# Patient Record
Sex: Male | Born: 1965 | Race: Black or African American | Hispanic: No | Marital: Married | Smoking: Never smoker
Health system: Southern US, Community
[De-identification: ages and names within clinical notes are randomized; demographics above are authoritative.]

## PROBLEM LIST (undated history)

## (undated) ENCOUNTER — Ambulatory Visit (HOSPITAL_COMMUNITY): Admission: EM | Source: Home / Self Care

## (undated) DIAGNOSIS — C801 Malignant (primary) neoplasm, unspecified: Secondary | ICD-10-CM

---

## 1998-10-04 ENCOUNTER — Encounter: Payer: Self-pay | Admitting: Emergency Medicine

## 1998-10-05 ENCOUNTER — Inpatient Hospital Stay (HOSPITAL_COMMUNITY): Admission: EM | Admit: 1998-10-05 | Discharge: 1998-10-09 | Payer: Self-pay | Admitting: Emergency Medicine

## 2000-10-24 ENCOUNTER — Encounter: Payer: Self-pay | Admitting: Family Medicine

## 2000-10-24 ENCOUNTER — Encounter: Admission: RE | Admit: 2000-10-24 | Discharge: 2000-10-24 | Payer: Self-pay | Admitting: Family Medicine

## 2001-10-25 ENCOUNTER — Emergency Department (HOSPITAL_COMMUNITY): Admission: EM | Admit: 2001-10-25 | Discharge: 2001-10-26 | Payer: Self-pay | Admitting: Emergency Medicine

## 2001-10-26 ENCOUNTER — Encounter: Payer: Self-pay | Admitting: Emergency Medicine

## 2002-12-21 ENCOUNTER — Encounter: Admission: RE | Admit: 2002-12-21 | Discharge: 2002-12-21 | Payer: Self-pay | Admitting: Internal Medicine

## 2002-12-21 ENCOUNTER — Encounter: Payer: Self-pay | Admitting: Internal Medicine

## 2003-08-24 ENCOUNTER — Emergency Department (HOSPITAL_COMMUNITY): Admission: EM | Admit: 2003-08-24 | Discharge: 2003-08-24 | Payer: Self-pay | Admitting: Emergency Medicine

## 2005-07-20 ENCOUNTER — Encounter: Admission: RE | Admit: 2005-07-20 | Discharge: 2005-07-20 | Payer: Self-pay | Admitting: Internal Medicine

## 2005-11-03 ENCOUNTER — Encounter: Payer: Self-pay | Admitting: Emergency Medicine

## 2016-12-09 ENCOUNTER — Encounter (HOSPITAL_COMMUNITY): Payer: Self-pay | Admitting: Emergency Medicine

## 2016-12-09 ENCOUNTER — Ambulatory Visit (HOSPITAL_COMMUNITY)
Admission: EM | Admit: 2016-12-09 | Discharge: 2016-12-09 | Disposition: A | Discharge status: Home / Self Care | Payer: Self-pay | Attending: Internal Medicine | Admitting: Internal Medicine

## 2016-12-09 DIAGNOSIS — Z760 Encounter for issue of repeat prescription: Secondary | ICD-10-CM

## 2016-12-09 MED ORDER — VALACYCLOVIR HCL 500 MG PO TABS: 500.0000 mg | ORAL_TABLET | Freq: Two times a day (BID) | ORAL | 1 refills | Status: AC

## 2016-12-09 NOTE — ED Triage Notes (Signed)
Patient requesting valtrex refill.

## 2016-12-09 NOTE — ED Provider Notes (Signed)
CSN: [id number]     Arrival date & time 12/09/16  1023 History   None    Chief Complaint  Patient presents with  . Medication Refill   (Consider location/radiation/quality/duration/timing/severity/associated sxs/prior Treatment) Patient request refills on valtrex.   The history is provided by the patient.    History reviewed. No pertinent past medical history. History reviewed. No pertinent surgical history. No family history on file. Social History  Substance Use Topics  . Smoking status: Never Smoker  . Smokeless tobacco: Not on file  . Alcohol use Yes    Review of Systems  Constitutional: Negative.   HENT: Negative.   Eyes: Negative.   Respiratory: Negative.   Cardiovascular: Negative.   Gastrointestinal: Negative.   Endocrine: Negative.   Genitourinary: Negative.   Musculoskeletal: Negative.   Allergic/Immunologic: Negative.   Neurological: Negative.   Hematological: Negative.   Psychiatric/Behavioral: Negative.     Allergies  Patient has no known allergies.  Home Medications   Prior to Admission medications   Medication Sig Start Date End Date Taking? Authorizing Provider  valACYclovir (VALTREX) 500 MG tablet Take 1 tablet (500 mg total) by mouth 2 (two) times daily. 12/09/16   Deatra Canterxford, Enio J, FNP   Meds Ordered and Administered this Visit  Medications - No data to display  BP 138/90 (BP Location: Right Arm)   Pulse 67   Temp 98.3 F (36.8 C) (Oral)   Resp 18   SpO2 100%  No data found.   Physical Exam  Constitutional: He is oriented to person, place, and time. He appears well-developed and well-nourished.  HENT:  Head: Normocephalic and atraumatic.  Right Ear: External ear normal.  Left Ear: External ear normal.  Mouth/Throat: Oropharynx is clear and moist.  Eyes: Conjunctivae and EOM are normal. Pupils are equal, round, and reactive to light.  Neck: Normal range of motion. Neck supple.  Cardiovascular: Normal rate, regular rhythm and normal  heart sounds.   Pulmonary/Chest: Effort normal.  Abdominal: Soft. Bowel sounds are normal.  Neurological: He is alert and oriented to person, place, and time.  Skin: Skin is warm and dry.  Nursing note and vitals reviewed.   Urgent Care Course     Procedures (including critical care time)  Labs Review Labs Reviewed - No data to display  Imaging Review No results found.   Visual Acuity Review  Right Eye Distance:   Left Eye Distance:   Bilateral Distance:    Right Eye Near:   Left Eye Near:    Bilateral Near:         MDM   1. Medication refill    Valtrex 500mg  one po bid #60w/1 rf  Referrel to PCP [facility]      Deatra CanterOxford, Sofia J, [place]NP 12/09/16 1222

## 2019-06-22 ENCOUNTER — Ambulatory Visit: Payer: HRSA Program | Attending: Internal Medicine

## 2019-06-22 DIAGNOSIS — Z20828 Contact with and (suspected) exposure to other viral communicable diseases: Secondary | ICD-10-CM | POA: Diagnosis not present

## 2019-06-22 DIAGNOSIS — Z20822 Contact with and (suspected) exposure to covid-19: Secondary | ICD-10-CM

## 2019-06-23 LAB — NOVEL CORONAVIRUS, NAA: SARS-CoV-2, NAA: NOT DETECTED

## 2019-07-10 ENCOUNTER — Ambulatory Visit: Payer: Self-pay | Attending: Internal Medicine

## 2019-07-10 DIAGNOSIS — Z20822 Contact with and (suspected) exposure to covid-19: Secondary | ICD-10-CM | POA: Insufficient documentation

## 2019-07-12 LAB — NOVEL CORONAVIRUS, NAA: SARS-CoV-2, NAA: NOT DETECTED

## 2020-01-30 ENCOUNTER — Ambulatory Visit: Payer: HRSA Program | Attending: Internal Medicine

## 2020-01-30 DIAGNOSIS — Z20822 Contact with and (suspected) exposure to covid-19: Secondary | ICD-10-CM | POA: Diagnosis present

## 2020-01-31 LAB — NOVEL CORONAVIRUS, NAA: SARS-CoV-2, NAA: NOT DETECTED

## 2020-01-31 LAB — SARS-COV-2, NAA 2 DAY TAT

## 2020-11-24 ENCOUNTER — Ambulatory Visit
Admission: RE | Admit: 2020-11-24 | Discharge: 2020-11-24 | Disposition: A | Discharge status: Home / Self Care | Payer: BC Managed Care – PPO | Source: Ambulatory Visit | Attending: Internal Medicine | Admitting: Internal Medicine

## 2020-11-24 ENCOUNTER — Other Ambulatory Visit: Payer: Self-pay | Admitting: Internal Medicine

## 2020-11-24 DIAGNOSIS — M25511 Pain in right shoulder: Secondary | ICD-10-CM

## 2021-06-10 ENCOUNTER — Other Ambulatory Visit (HOSPITAL_COMMUNITY): Payer: Self-pay | Admitting: Physician Assistant

## 2021-06-10 ENCOUNTER — Other Ambulatory Visit: Payer: Self-pay | Admitting: Physician Assistant

## 2021-06-10 DIAGNOSIS — Z85819 Personal history of malignant neoplasm of unspecified site of lip, oral cavity, and pharynx: Secondary | ICD-10-CM

## 2021-06-11 ENCOUNTER — Ambulatory Visit (HOSPITAL_COMMUNITY)
Admission: RE | Admit: 2021-06-11 | Discharge: 2021-06-11 | Disposition: A | Discharge status: Home / Self Care | Payer: No Typology Code available for payment source | Source: Ambulatory Visit | Attending: Physician Assistant | Admitting: Physician Assistant

## 2021-06-11 DIAGNOSIS — Z85819 Personal history of malignant neoplasm of unspecified site of lip, oral cavity, and pharynx: Secondary | ICD-10-CM | POA: Insufficient documentation

## 2021-06-11 MED ORDER — IOHEXOL 350 MG/ML SOLN
60.0000 mL | Freq: Once | INTRAVENOUS | Status: AC | PRN
  Administered 2021-06-11: 60 mL via INTRAVENOUS

## 2022-03-24 NOTE — Progress Notes (Signed)
Late entry. Responded to a person who had passed out in/near hot tub around 130p Lifeguards present. Sleepy by arousable to questions. Answered with short questions. Did not eat today. Reminded to eat prior to and not be in hot tub for long periods of time. Encouraged no alcohol prior to use.  Thinks he go too hot. EMS on way.  BP while lying 84/60 O2 92. Reminded to breathe.  No obvious injuries.  EMS arrived and assessed pt. Unsure if pt consented to transport for further eval.

## 2022-05-14 IMAGING — CT CT NECK W/ CM
3 of 5 series · 9 of 33 positions shown, 11 images · IV contrast (omnipaque)
Comparison: Neck CT reports from 01/13/2017 and 11/04/2016

CLINICAL DATA: Throat swelling and pain. History of nasopharyngeal
carcinoma treated with chemotherapy and radiation.

EXAM:
CT NECK WITH CONTRAST
TECHNIQUE: Multidetector CT imaging of the neck was performed using the
standard protocol following the bolus administration of intravenous
contrast.
CONTRAST:  60mL OMNIPAQUE IOHEXOL 350 MG/ML SOLN

[Series 5: orthogonal (person_name) · axial · 0.32mm/px · z∈[+1418,+1418]mm · 1 of 187 slices shown, 2 images]
[im 112/187  soft-tissue]
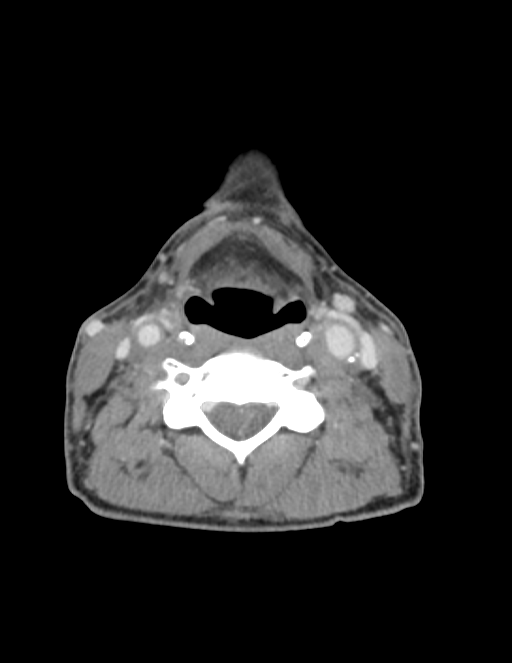
[im 112/187  bone]
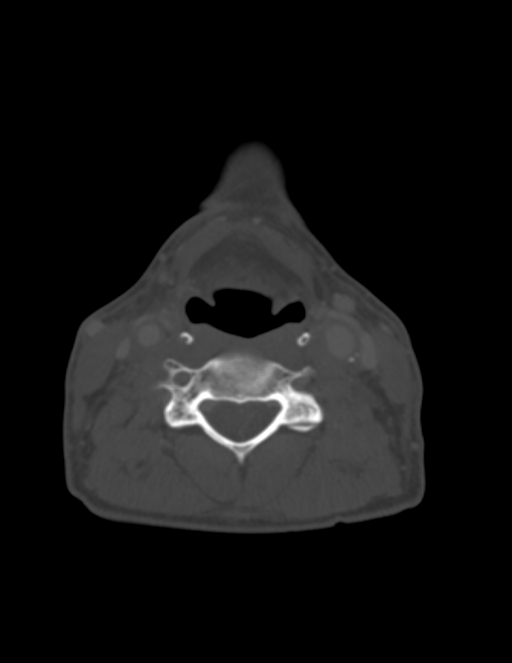

[Series 6: cor neck · coronal · 0.32mm/px · 3 of 108 slices shown]
[im 37/108  bone]
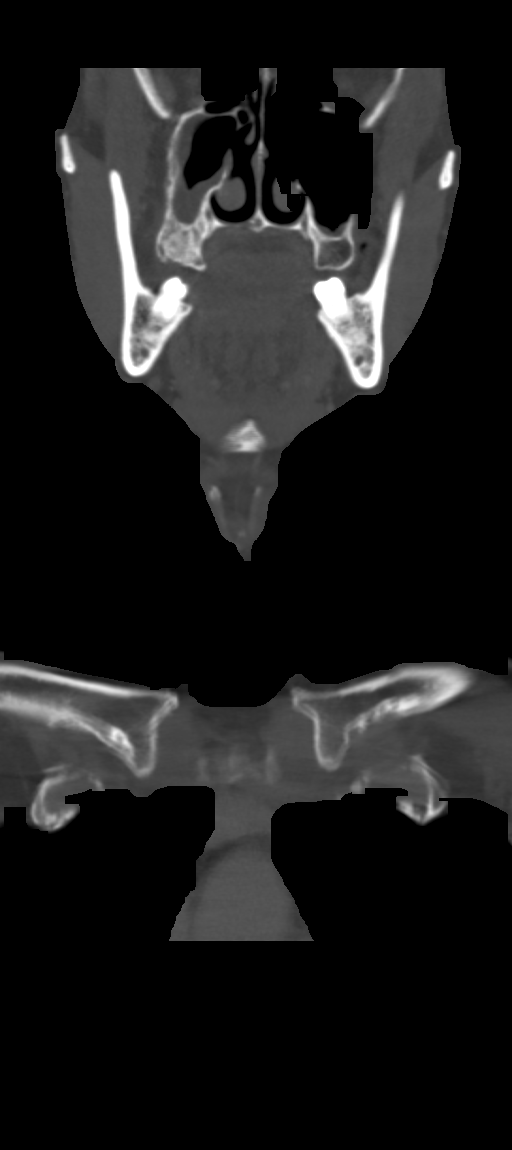
[im 48/108  bone]
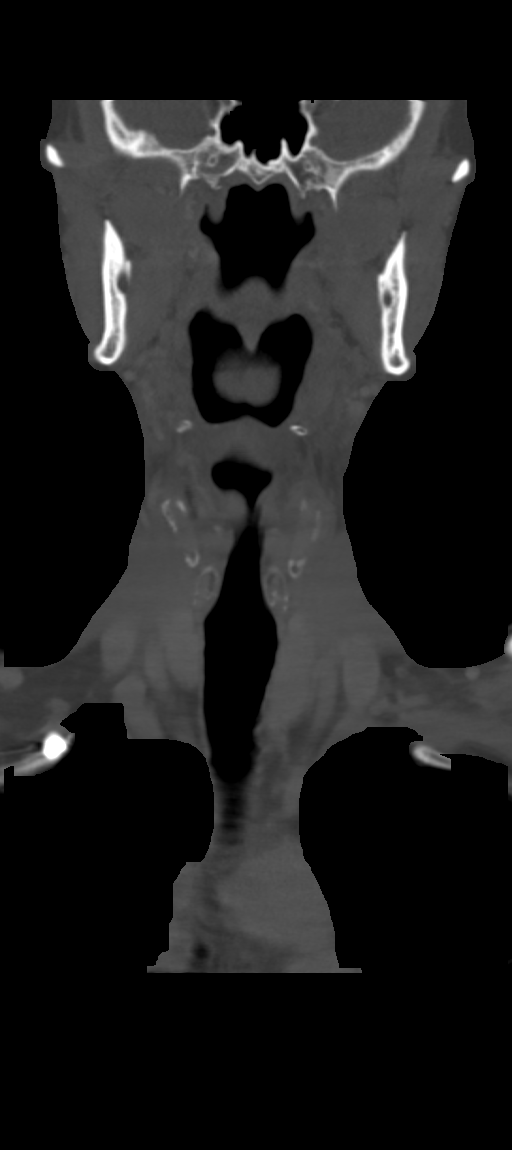
[im 60/108  bone]
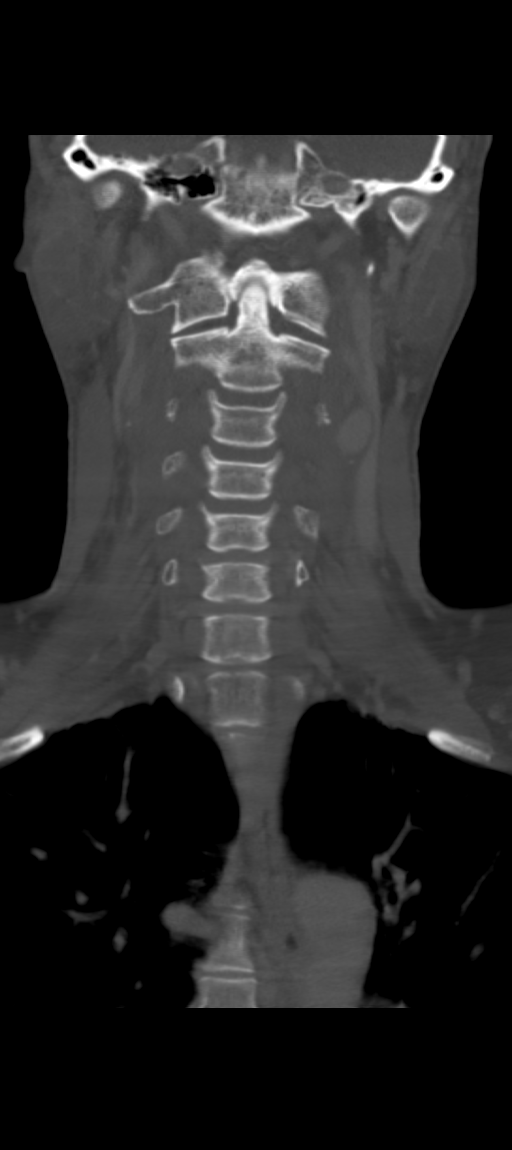

[Series 7: sag neck · sagittal · 0.42mm/px · 5 of 84 slices shown, 6 images]
[im 28/84  bone]
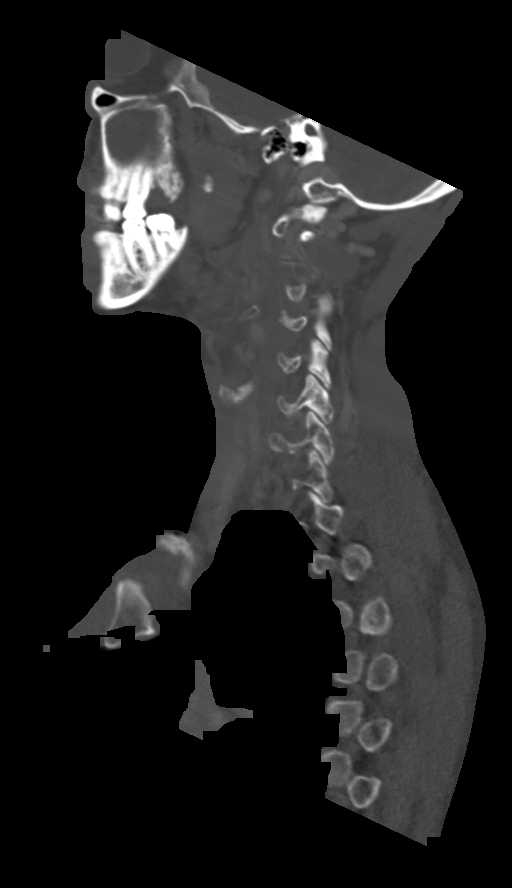
[im 35/84  bone]
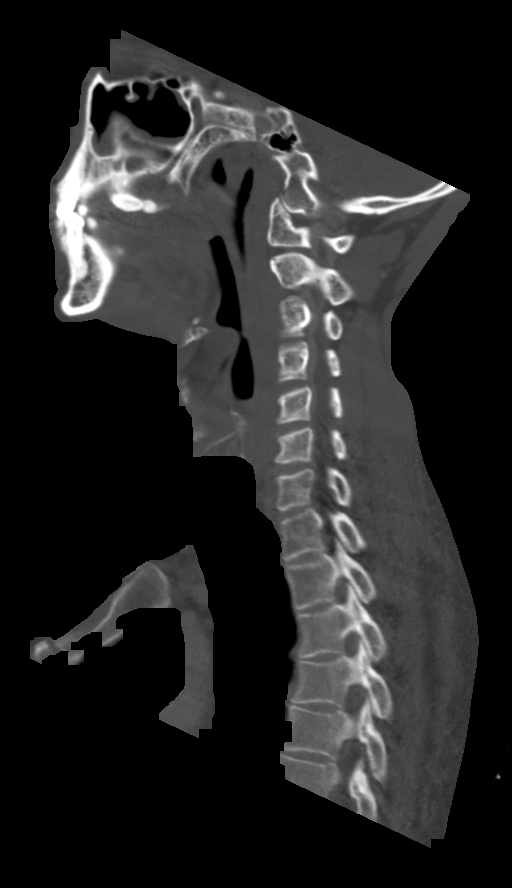
[im 42/84  soft-tissue]
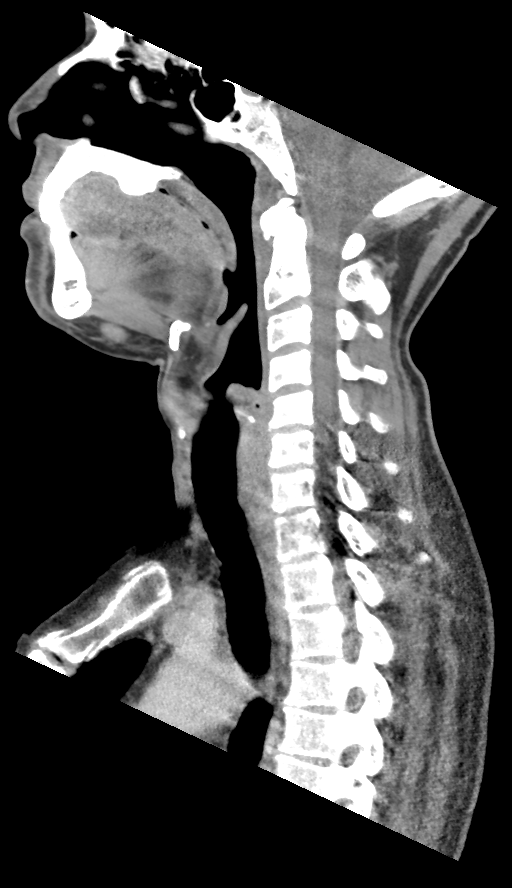
[im 42/84  bone]
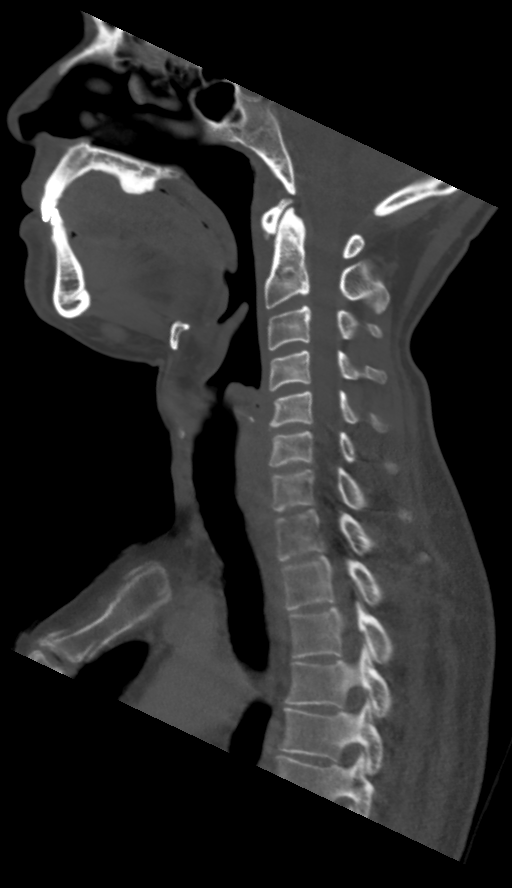
[im 49/84  bone]
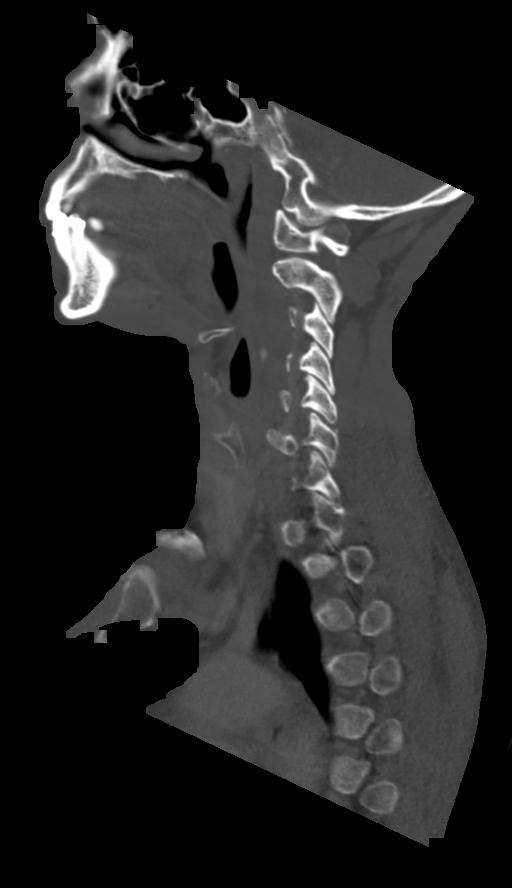
[im 56/84  bone]
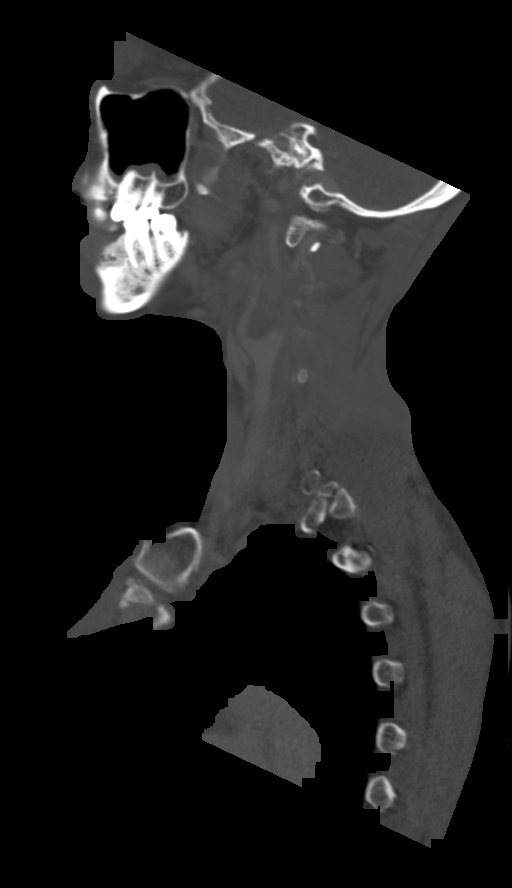

[9 of 33 positions shown; findings below may reference images not displayed]

FINDINGS: Pharynx and larynx: No mass is identified. Mild low-density
pharyngeal edema, including of the epiglottis, is likely treatment
related. No parapharyngeal or retropharyngeal fluid collection.
Widely patent airway.

Salivary glands: Post radiation changes. No mass or stone
identified.

Thyroid: Unremarkable.

Lymph nodes: Mildly prominent submental lymph node measuring 8 mm in
short axis, also described in the prior CT reports and likely
benign. No enlarged or suspicious lymph nodes elsewhere in the neck.

Vascular: Major vascular structures of the neck are grossly patent.
Intimal thickening and mild atherosclerotic calcification at the
carotid bifurcations.

Limited intracranial: Unremarkable.

Visualized orbits: Unremarkable.

Mastoids and visualized paranasal sinuses: Moderate right and mild
left maxillary sinus mucosal thickening. Clear mastoid air cells.

Skeleton: No suspicious osseous lesion.

Upper chest: No apical lung consolidation or mass.

Other: None.
IMPRESSION: Post treatment changes without evidence of recurrent tumor or acute
abnormality in the neck.

## 2024-06-25 ENCOUNTER — Encounter (HOSPITAL_COMMUNITY): Payer: Self-pay

## 2024-06-25 ENCOUNTER — Other Ambulatory Visit: Payer: Self-pay

## 2024-06-25 ENCOUNTER — Emergency Department (HOSPITAL_COMMUNITY)
Admission: EM | Admit: 2024-06-25 | Discharge: 2024-06-25 | Disposition: A | Discharge status: Home / Self Care | Attending: Emergency Medicine | Admitting: Emergency Medicine

## 2024-06-25 DIAGNOSIS — Z859 Personal history of malignant neoplasm, unspecified: Secondary | ICD-10-CM | POA: Insufficient documentation

## 2024-06-25 DIAGNOSIS — R519 Headache, unspecified: Secondary | ICD-10-CM | POA: Diagnosis not present

## 2024-06-25 DIAGNOSIS — R040 Epistaxis: Secondary | ICD-10-CM | POA: Insufficient documentation

## 2024-06-25 HISTORY — DX: Malignant (primary) neoplasm, unspecified: C80.1

## 2024-06-25 LAB — BASIC METABOLIC PANEL WITH GFR
Anion gap: 16 — ABNORMAL HIGH (ref 5–15)
BUN: 12 mg/dL (ref 6–20)
CO2: 22 mmol/L (ref 22–32)
Calcium: 9.3 mg/dL (ref 8.9–10.3)
Chloride: 101 mmol/L (ref 98–111)
Creatinine, Ser: 0.89 mg/dL (ref 0.61–1.24)
GFR, Estimated: >60 mL/min
Glucose, Bld: 118 mg/dL — ABNORMAL HIGH (ref 70–99)
Potassium: 3.9 mmol/L (ref 3.5–5.1)
Sodium: 139 mmol/L (ref 135–145)

## 2024-06-25 LAB — TYPE AND SCREEN
ABO/RH(D): A NEG
Antibody Screen: NEGATIVE

## 2024-06-25 LAB — CBC WITH DIFFERENTIAL/PLATELET
Abs Immature Granulocytes: 0.02 K/uL (ref 0.00–0.07)
Basophils Absolute: 0 K/uL (ref 0.0–0.1)
Basophils Relative: 1 %
Eosinophils Absolute: 0.2 K/uL (ref 0.0–0.5)
Eosinophils Relative: 3 %
HCT: 35.8 % — ABNORMAL LOW (ref 39.0–52.0)
Hemoglobin: 12.4 g/dL — ABNORMAL LOW (ref 13.0–17.0)
Immature Granulocytes: 0 %
Lymphocytes Relative: 23 %
Lymphs Abs: 1.3 K/uL (ref 0.7–4.0)
MCH: 34.1 pg — ABNORMAL HIGH (ref 26.0–34.0)
MCHC: 34.6 g/dL (ref 30.0–36.0)
MCV: 98.4 fL (ref 80.0–100.0)
Monocytes Absolute: 0.4 K/uL (ref 0.1–1.0)
Monocytes Relative: 7 %
Neutro Abs: 3.8 K/uL (ref 1.7–7.7)
Neutrophils Relative %: 66 %
Platelets: 387 K/uL (ref 150–400)
RBC: 3.64 MIL/uL — ABNORMAL LOW (ref 4.22–5.81)
RDW: 14.9 % (ref 11.5–15.5)
WBC: 5.8 K/uL (ref 4.0–10.5)
nRBC: 0.3 % — ABNORMAL HIGH (ref 0.0–0.2)

## 2024-06-25 LAB — PROTIME-INR
INR: 1 (ref 0.8–1.2)
Prothrombin Time: 13.9 s (ref 11.4–15.2)

## 2024-06-25 MED ORDER — LIDOCAINE-EPINEPHRINE (PF) 2 %-1:200000 IJ SOLN
INTRAMUSCULAR | Status: AC
  Administered 2024-06-25: 20 mL
  Filled 2024-06-25: qty 20

## 2024-06-25 MED ORDER — TRANEXAMIC ACID FOR EPISTAXIS
500.0000 mg | Freq: Once | TOPICAL | Status: DC
  Filled 2024-06-25: qty 10

## 2024-06-25 MED ORDER — SILVER NITRATE-POT NITRATE 75-25 % EX MISC
1.0000 | Freq: Once | CUTANEOUS | Status: AC
  Administered 2024-06-25: 1 via TOPICAL
  Filled 2024-06-25: qty 1

## 2024-06-25 MED ADMIN — Acetaminophen Tab 500 MG: 1000 mg | ORAL | NDC 50580045711

## 2024-06-25 MED FILL — Acetaminophen Tab 500 MG: 1000.0000 mg | ORAL | Qty: 2 | Status: AC

## 2024-06-25 NOTE — ED Triage Notes (Signed)
 Per wife, this episode of bleeding has been going on since 16:20 and this is pts 2nd nosebleed of the day.

## 2024-06-25 NOTE — ED Provider Notes (Signed)
 "  Emergency Department Provider Note   I have reviewed the triage vital signs and the nursing notes.   HISTORY  Chief Complaint Epistaxis   HPI Curtis Knox is a 58 y.o. male with past history reviewed below presents emergency department with left-sided epistaxis.  This has been intermittent for the past 3 weeks.  He has had 2 nosebleeds today since around 4:20 PM.  He is having fairly heavy bleeding followed by some vomiting when he swallows blood.  He has some associated headache.  He is not anticoagulated.  No nasal trauma.    Past Medical History:  Diagnosis Date   Cancer [facility])     Review of Systems  Constitutional: No fever/chills ENT: Positive left side epistaxis.  Cardiovascular: Denies chest pain. Respiratory: Denies shortness of breath. Gastrointestinal: No abdominal pain.  Neurological: Positive HA.   ____________________________________________   PHYSICAL EXAM:  VITAL SIGNS: ED Triage Vitals  Encounter Vitals Group     BP 06/25/24 1659 (!) 159/99     Pulse Rate 06/25/24 1659 98     Resp 06/25/24 1659 19     Temp 06/25/24 1659 97.9 F (36.6 C)     Temp src --      SpO2 06/25/24 1659 93 %     Weight 06/25/24 1706 175 lb (79.4 kg)   Constitutional: Alert and oriented. Well appearing and in no acute distress. Eyes: Conjunctivae are normal. Head: Atraumatic. Nose: No congestion/rhinnorhea. Fairly heavy epistaxis from the left nostril.  Mouth/Throat: Mucous membranes are moist.   Neck: No stridor.   Cardiovascular: Normal rate, regular rhythm. Good peripheral circulation. Grossly normal heart sounds.   Respiratory: Normal respiratory effort.   Gastrointestinal: No distention.  Musculoskeletal: No gross deformities of extremities. Neurologic:  Normal speech and language.  Skin:  Skin is warm, dry and intact. No rash noted.  ____________________________________________   LABS (all labs ordered are listed, but only abnormal results are  displayed)  Labs Reviewed  PROTIME-INR  BASIC METABOLIC PANEL WITH GFR  CBC WITH DIFFERENTIAL/PLATELET  TYPE AND SCREEN   ____________________________________________  RADIOLOGY  No results found.  ____________________________________________   PROCEDURES  Procedure(s) performed:   Procedures   ____________________________________________   INITIAL IMPRESSION / ASSESSMENT AND PLAN / ED COURSE  Pertinent labs & imaging results that were available during my care of the patient were reviewed by me and considered in my medical decision making (see chart for details).   This patient is Presenting for Evaluation of epistaxis, which does require a range of treatment options, and is a complaint that involves a moderate risk of morbidity and mortality.  The Differential Diagnoses include anterior epistaxis, posterior epistaxis, coagulopathy, etc.  Critical Interventions-    Medications  lidocaine -EPINEPHrine  (XYLOCAINE  W/EPI) 2 %-1:200000 (PF) injection (has no administration in time range)  silver  nitrate applicators applicator 1 Stick (has no administration in time range)    Reassessment after intervention:     I did obtain Additional Historical Information from wife at bedside.    Clinical Laboratory Tests Ordered, included ***  Radiologic Tests Ordered, included ***. I independently interpreted the images and agree with radiology interpretation.   Cardiac Monitor Tracing which shows ***   Social Determinants of Health Risk ***  Consult complete with  Medical Decision Making: Summary:  The patient arrives with left epistaxis.  Unable to visualize the source initially.  I placed a pledget of lidocaine  with epinephrine  in the nose and will hold constant pressure for 20 minutes and reassess.  Reevaluation with update and discussion with   ***Considered admission***  Patient's presentation is most consistent with {EM COPA:27473}   Disposition:    ____________________________________________  FINAL CLINICAL IMPRESSION(S) / ED DIAGNOSES  Final diagnoses:  None     NEW OUTPATIENT MEDICATIONS STARTED DURING THIS VISIT:  New Prescriptions   No medications on file    Note:  This document was prepared using Dragon voice recognition software and may include unintentional dictation errors.  Fonda Law, MD, [facility]  "

## 2024-06-25 NOTE — Discharge Instructions (Signed)
As we discussed, there are several techniques you can use to prevent or stop nosebleeds in the future.  Keep your nose moist either with saline spray several times a day or by applying a thin layer of Neosporin, bacitracin, or other antibiotic ointment to the inside of your nose once or twice a day.  If the bleeding starts up again, gently blow your nose into a tissue to clear the blood and clots, then apply 1-2 sprays to each affected nostril of over-the-counter Afrin nasal spray (oxymetazoline).   Then squeeze your nose shut tightly and DO NOT PEEK for at least 15 minutes.  This will resolve most nosebleeds.  If you continue to have trouble after trying these techniques, or anything seems out of the ordinary or concerns you, please return tot he Emergency Department.  

## 2024-06-25 NOTE — ED Triage Notes (Signed)
 Patient here with nose bleeds for the past 3 weeks. He is dizzy on arrival to ED. His nose is actively bleeding. He is also coughing up blood that he is swallowing. He has a headache.   Denies high blood pressure

## 2024-06-26 MED ORDER — LIDOCAINE-EPINEPHRINE-TETRACAINE (LET) TOPICAL GEL
3.0000 mL | TOPICAL | Status: AC
  Administered 2024-06-26: 3 mL
